# Patient Record
Sex: Male | Born: 1980
Health system: Southern US, Community
[De-identification: ages and names within clinical notes are randomized; demographics above are authoritative.]

## PROBLEM LIST (undated history)

## (undated) DIAGNOSIS — E785 Hyperlipidemia, unspecified: Secondary | ICD-10-CM

## (undated) HISTORY — PX: APPENDECTOMY: SHX54

## (undated) HISTORY — DX: Hyperlipidemia, unspecified: E78.5

## (undated) HISTORY — PX: TONSILLECTOMY: SUR1361

---

## 2003-10-15 ENCOUNTER — Emergency Department (HOSPITAL_COMMUNITY): Admission: EM | Admit: 2003-10-15 | Discharge: 2003-10-15 | Payer: Self-pay | Admitting: Emergency Medicine

## 2003-10-15 ENCOUNTER — Ambulatory Visit (HOSPITAL_COMMUNITY): Admission: EM | Admit: 2003-10-15 | Discharge: 2003-10-16 | Payer: Self-pay | Admitting: Emergency Medicine

## 2003-10-16 ENCOUNTER — Encounter (INDEPENDENT_AMBULATORY_CARE_PROVIDER_SITE_OTHER): Payer: Self-pay | Admitting: Specialist

## 2009-06-26 ENCOUNTER — Emergency Department (HOSPITAL_COMMUNITY): Admission: EM | Admit: 2009-06-26 | Discharge: 2009-06-26 | Payer: Self-pay | Admitting: Family Medicine

## 2010-01-14 ENCOUNTER — Ambulatory Visit: Payer: Self-pay | Admitting: Family Medicine

## 2010-04-05 NOTE — Assessment & Plan Note (Signed)
Summary: WRIST PAIN PER [facility]   History of Present Illness: right wrist pain several weeks--has been doing some extra weigt lifting--no specific injury bit has increasingly bothered him now. Pain with wrist flexed and pushing type actions. No prior issues with wrist. no prior wrist surgeries. no diabetic  Review of Systems       no numbness or tingling in his hand  Physical Exam  Msk:  R wrist FROM. TTP dorsum b/w index and long finger metacarpals Additional Exam:  US reveals fairly large fluid collection which was aspirated under ultraspund, injected with 1/2 cc kennalog 40 under steril conditions.   Impression & Recommendations:  Problem # 1:  WRIST PAIN, RIGHT (ICD-719.43)  ganglion cyst aspiration, injection with kennalog. Pressure bandage today. No weight lifting  48 hrs  Orders: No Charge Patient Arrived (NCPA0) (NCPA0)   Orders Added: 1)  No Charge Patient Arrived (NCPA0) [NCPA0]

## 2010-07-22 NOTE — Op Note (Signed)
Justin Douglas, Justin Douglas                         ACCOUNT NO.:  [id number]   MEDICAL RECORD NO.:  [id number]                   PATIENT TYPE:  INP   LOCATION:  5731                                 FACILITY:  [facility]   PHYSICIAN:  Jimmye Norman III, M.D.               DATE OF BIRTH:  01/09/1981   DATE OF PROCEDURE:  10/15/2003  DATE OF DISCHARGE:  10/16/2003                                 OPERATIVE REPORT   PREOPERATIVE DIAGNOSES:  Acute appendicitis.   POSTOPERATIVE DIAGNOSES:  Acute suppurative appendicitis without  perforation.   OPERATION PERFORMED:  Laparoscopic appendectomy.   SURGEON:  Marta Lamas. Lindie Spruce, M.D.   ANESTHESIA:  General endotracheal.   ESTIMATED BLOOD LOSS:  Less than 20 mL.   COMPLICATIONS:  None.   CONDITION:  Good.   SPECIMENS:  Appendix.   FINDINGS:  No perforation.  Minimal purulent fluid in the pelvis.   INDICATIONS FOR PROCEDURE:  The patient is a 30 year old with right lower  quadrant pain associated with nausea and vomiting, white count and a fever,  who now comes in for laparoscopic appendectomy.   DESCRIPTION OF PROCEDURE:  The patient was taken to the operating room and  placed on the table in the supine position.  After an adequate endotracheal  anesthetic was administered, the patient was prepped and draped in the usual  sterile manner exposing the midline and right upper quadrant.  A  supraumbilical curvilinear incision was made using a #11 blade down to the  midline fascia.  Through this midline fascia, a Veress needle was passed  into the peritoneal cavity and confirmed to be in position with the saline  test.  Once this was done, carbon dioxide insufflation was instilled into  the peritoneal cavity up to a maximum intra-abdominal pressure of 15 mmHg.   The patient was placed in Trendelenburg position.  An OptiView cannula 11 mm  was passed with the attached camera and ___________ into the peritoneal  cavity under direct vision.  Once we were  in adequate position we attached  the carbon dioxide gas and then passed two additional cannulas, one in the  right upper quadrant, one in the suprapubic area, that one being a 12 mm  cannula.   The patient's left side was tilted down.  We could see that the appendix was  acutely inflamed.  We dissected it along its base and then subsequently once  we had an adequate window between the mesoappendix, the cecum and the  appendix itself, we passed a 3.5 mm Endo GIA across the base of the appendix  and fired it.  We then passed a 2.5 mm closure Endo GIA across the  mesoappendix, fired that and then subsequently brought the appendix out  through the suprapubic cannula with an EndoCatch bag without contamination  of the underlying subcutaneous tissue.  Once this was done, we irrigated  with saline, about 700 mL was used.  We did that and subsequently removed  all cannulas, aspirating gas as we came out.   Once all cannulas were out, we reapproximated the supraumbilical fascia  using a figure-of-eight stitch of 0 Vicryl.  0.25% Marcaine with epinephrine  was injected at all sites and then the skin was closed using running  subcuticular stitch of 5-0 Vicryl.  Sterile dressings were applied including  Steri-Strips and antibiotic ointment.                                               Kathrin Ruddy, M.D.    JW/MEDQ  D:  10/15/2003  T:  10/16/2003  Job:  [id number]

## 2010-07-22 NOTE — H&P (Signed)
Justin Douglas, Justin Douglas                         ACCOUNT NO.:  [id number]   MEDICAL RECORD NO.:  [id number]                   PATIENT TYPE:  INP   LOCATION:  1828                                 FACILITY:  [facility]   PHYSICIAN:  Jimmye Norman III, M.D.               DATE OF BIRTH:  06/02/1980   DATE OF ADMISSION:  10/15/2003  DATE OF DISCHARGE:                                HISTORY & PHYSICAL   CHIEF COMPLAINT:  The patient is a 30 year old male with right lower  quadrant abdominal pain and leukocytosis likely acute appendicitis.   HISTORY OF PRESENT ILLNESS:  The patient stated getting ill about 2:00  o'clock this morning at home when he developed nausea, some vomiting,  diffuse lower abdominal pain.  It got to the point where he came to the  emergency room where he was evaluated and not thought to have any symptoms  significant for appendicitis.  He had a CBC done which showed an increased  white blood cell count of 11,000 with a left shift, he was sent home after  getting a GI cocktail.  He returned later on after his pain worsened in  spite of getting nausea medication.  His white count increased to 14,000,  his pain is localized more to the right lower quadrant, surgical  consultation was obtained.   PAST MEDICAL HISTORY:  Unremarkable.  He takes no medications.   PAST SURGICAL HISTORY:  Tonsils and adenoids as an infant.   ALLERGIES:  HE HAS NO KNOWN DRUG ALLERGIES.   FAMILY HISTORY:  Unremarkable and noncontributory.   REVIEW OF SYSTEMS:  He has had nausea, vomiting, poor appetite all day, no  chills or fevers, although he does have a slight temp up to 99.7 currently.   PHYSICAL EXAMINATION:  As mentioned his temp was at 98.1, he is now up to  99.7.  His pulse is normal.  Blood pressure within normal limits.  He is  normocephalic and atraumatic, anicteric.  He does appear to be in distress  even laying still on the gurney.  NECK:  Supple.  No palpable masses.  No thyroid  lesion.  LUNGS:  Clear to auscultation.  CARDIAC:  Regular rhythm and rate with no murmurs, gallops, rubs or heaves.  ABDOMEN:  Slightly distended for a very thin young man with hypoactive bowel  sounds, positive Rovsing's sign, positive guarding, rebound, and tap and  cough tenderness.  RECTAL:  No palpable masses.   LABORATORY STUDIES:  White count 14,000, hemoglobin 16.  UA is pending.  CT  scan was not done.   IMPRESSION:  Likely acute appendicitis in an otherwise healthy 30 year old  male.   PLAN:  Performed a laparoscopic appendectomy.  This will be done in the  operating room as soon as possible.  I have explained the procedure to the  patient and to his mother who is present with him, his father is on  the way  in, we will talk with him after surgery.  A Foley will be inserted at the  time of surgery and removed prior to awakening.                                                Kathrin Ruddy, M.D.    JW/MEDQ  D:  10/15/2003  T:  10/15/2003  Job:  [id number]

## 2011-03-14 ENCOUNTER — Encounter: Payer: Self-pay | Admitting: Family Medicine

## 2011-03-14 NOTE — Progress Notes (Signed)
Patient ID: Justin Douglas, male   DOB: [birth year], 31 y.o.   MRN: [id number] LDL cholesterol of 184. Eats fairly healthy diet--trying to get insurance coverage and it will be lower if he is at goal. We discussed and will start simvastatin and he will f/u in 6 weeks with me. Will get FLP and CMP then. I reviewed his labs fromhealth screening

## 2011-04-28 ENCOUNTER — Encounter: Payer: Self-pay | Admitting: Family Medicine

## 2011-04-28 ENCOUNTER — Other Ambulatory Visit: Payer: Self-pay | Admitting: Family Medicine

## 2011-04-28 DIAGNOSIS — N63 Unspecified lump in unspecified breast: Secondary | ICD-10-CM

## 2011-04-28 DIAGNOSIS — N62 Hypertrophy of breast: Secondary | ICD-10-CM | POA: Insufficient documentation

## 2011-04-28 NOTE — Progress Notes (Signed)
Patient ID: Justin Douglas, male   DOB: January 22, 1981, 31 y.o.   MRN: [id number] Found a right sided breast lump a few days ago. Non tender . No d/c. US revealed a mass that does not appear to be hyper echoic. Will send for formal US and dx mammography. No use of anabloic steroids recently, but many years ago, he did use some. No tobacco. No FH breast ca.

## 2011-05-08 ENCOUNTER — Other Ambulatory Visit: Payer: Self-pay | Admitting: Family Medicine

## 2011-05-08 ENCOUNTER — Ambulatory Visit
Admission: RE | Admit: 2011-05-08 | Discharge: 2011-05-08 | Disposition: A | Discharge status: Home / Self Care | Payer: 59 | Source: Ambulatory Visit | Attending: Family Medicine | Admitting: Family Medicine

## 2011-05-08 ENCOUNTER — Telehealth: Payer: Self-pay | Admitting: Family Medicine

## 2011-05-08 ENCOUNTER — Ambulatory Visit (INDEPENDENT_AMBULATORY_CARE_PROVIDER_SITE_OTHER): Payer: 59 | Admitting: Family Medicine

## 2011-05-08 DIAGNOSIS — N62 Hypertrophy of breast: Secondary | ICD-10-CM

## 2011-05-08 DIAGNOSIS — J019 Acute sinusitis, unspecified: Secondary | ICD-10-CM | POA: Insufficient documentation

## 2011-05-08 DIAGNOSIS — N63 Unspecified lump in unspecified breast: Secondary | ICD-10-CM

## 2011-05-08 MED ORDER — AZITHROMYCIN 250 MG PO TABS: ORAL_TABLET | ORAL | Status: AC

## 2011-05-08 NOTE — Progress Notes (Signed)
  Subjective:    Patient ID: CLOYD RAGAS, male    DOB: 04-19-1980, 30 y.o.   MRN: [id number]  HPI 1. Sinusitis One week hx of frontal headache with tooth pain and sensitivity as well as congestion, rhinorrhea (non-purulent). Sore throat and postnasal drip.  No cough. No chest pain or SOB. Not able to sleep because of congestion.  No sick contacts. No recent medications.    Review of Systems Pertinent items are noted in HPI. No fever, chills, night sweats, weight loss.     Objective:   Physical Exam General: c/m, appears tired with dark circles under eyes. Pulse: tachycardic on palpation and auscultation Lungs:  Normal respiratory effort, chest expands symmetrically. Lungs are clear to auscultation, no crackles or wheezes. Heart - Regular rate and rhythm.  No murmurs, gallops or rubs.    Ears:  External ear exam shows no significant lesions or deformities.  Otoscopic examination reveals clear canals, tympanic membranes are intact bilaterally with minimal  bulging, no retraction, inflammation or discharge. Hearing is grossly normal bilaterally Throat: normal mucosa, no exudate, uvula midline, no redness Sinus: tender to palpation of frontal and maxillary sinus. Nose:  External nasal examination shows no deformity or inflammation. Nasal mucosa are pink and without lesions or exudates. No septal dislocation or dislocation.No obstruction to airflow. Right breast: non-tender, non-cystic mass at 9 oclock. No skin changes, no nipple discharge. Mass is not fully mobile.    Assessment & Plan:   Breast mass being followed by another physician - he is having a mammogram today, ultrasound did not reveal cystic structure.

## 2011-05-08 NOTE — Patient Instructions (Signed)

## 2011-05-08 NOTE — Assessment & Plan Note (Signed)
Sinusitis Will give 3 day course of azithromycin because of protracted course (one week)

## 2011-05-08 NOTE — Telephone Encounter (Signed)
Normal mammo--notified by phone---will watch and see of breast tissue increases in size---if it does, consider w/u gynecomastiqa

## 2011-05-09 ENCOUNTER — Other Ambulatory Visit: Payer: Self-pay | Admitting: Family Medicine

## 2011-05-09 DIAGNOSIS — E785 Hyperlipidemia, unspecified: Secondary | ICD-10-CM | POA: Insufficient documentation

## 2011-05-09 DIAGNOSIS — N62 Hypertrophy of breast: Secondary | ICD-10-CM

## 2011-05-10 ENCOUNTER — Other Ambulatory Visit: Payer: 59

## 2011-05-10 DIAGNOSIS — E785 Hyperlipidemia, unspecified: Secondary | ICD-10-CM

## 2011-05-10 DIAGNOSIS — N62 Hypertrophy of breast: Secondary | ICD-10-CM

## 2011-05-10 LAB — BASIC METABOLIC PANEL
Chloride: 103 mEq/L (ref 96–112)
Sodium: 141 mEq/L (ref 135–145)

## 2011-05-10 LAB — LIPID PANEL: Cholesterol: 164 mg/dL (ref 0–200)

## 2011-05-10 NOTE — Progress Notes (Signed)
Addended by: Deno Etienne on: 05/10/2011 08:29 AM   Modules accepted: Orders

## 2011-05-10 NOTE — Progress Notes (Signed)
Labs done today Justin Douglas 

## 2011-05-11 ENCOUNTER — Encounter: Payer: Self-pay | Admitting: Family Medicine

## 2011-05-11 LAB — ESTRADIOL, FREE

## 2011-05-12 LAB — LUTEINIZING HORMONE: LH: 7.2 m[IU]/mL (ref 1.5–9.3)

## 2011-05-12 LAB — TESTOSTERONE: Testosterone: 320.52 ng/dL (ref 250–890)

## 2011-05-12 LAB — ESTRADIOL: Estradiol: 20.4 pg/mL

## 2011-05-14 LAB — BETA HCG QUANT (REF LAB): Beta hCG, Tumor Marker: <0.5 m[IU]/mL (ref <5.0)

## 2011-05-29 ENCOUNTER — Other Ambulatory Visit: Payer: Self-pay | Admitting: Family Medicine

## 2011-05-29 DIAGNOSIS — R05 Cough: Secondary | ICD-10-CM

## 2011-05-29 NOTE — Progress Notes (Signed)
6 Weeks if continued cough status post respiratory infection. He says it usually takes 2 months for his cough to clear which is very unusual. I think he needs a chest x-ray we'll set that up. His lungs have occasional only scattered crackles no expiratory wheeze on exam.

## 2011-05-31 ENCOUNTER — Ambulatory Visit (HOSPITAL_COMMUNITY)
Admission: RE | Admit: 2011-05-31 | Discharge: 2011-05-31 | Disposition: A | Discharge status: Home / Self Care | Payer: 59 | Source: Ambulatory Visit | Attending: Family Medicine | Admitting: Family Medicine

## 2011-05-31 DIAGNOSIS — R059 Cough, unspecified: Secondary | ICD-10-CM | POA: Insufficient documentation

## 2011-05-31 DIAGNOSIS — R05 Cough: Secondary | ICD-10-CM | POA: Insufficient documentation

## 2011-06-23 ENCOUNTER — Other Ambulatory Visit: Payer: Self-pay | Admitting: Family Medicine

## 2011-06-23 DIAGNOSIS — J45991 Cough variant asthma: Secondary | ICD-10-CM

## 2011-06-23 NOTE — Progress Notes (Signed)
Cough for 6-7 weeks--notes he gets a persistent cough after almost any viral URI. He has no wheezes in his lungs, normal resp effort. Cough is aggravating, non productive. We did CXR and negative. i suspect this is an asthma variant---let's set up pfts.He is a non smoker.

## 2011-06-27 ENCOUNTER — Other Ambulatory Visit: Payer: Self-pay | Admitting: Family Medicine

## 2011-06-27 DIAGNOSIS — R05 Cough: Secondary | ICD-10-CM

## 2011-06-27 MED ORDER — BECLOMETHASONE DIPROPIONATE 40 MCG/ACT IN AERS: 2.0000 | INHALATION_SPRAY | Freq: Two times a day (BID) | RESPIRATORY_TRACT | Status: DC

## 2011-07-11 ENCOUNTER — Other Ambulatory Visit: Payer: Self-pay | Admitting: Family Medicine

## 2011-07-11 MED ORDER — ESOMEPRAZOLE MAGNESIUM 40 MG PO CPDR: 40.0000 mg | DELAYED_RELEASE_CAPSULE | Freq: Every day | ORAL | Status: DC

## 2011-07-11 NOTE — Progress Notes (Signed)
Justin Douglas is having no improvement in cough---worse at night or if he talks a lot or laughs.I am wondering is thereis vocal cord irritation / inflammation or dysfunction at the heart of all of this. He has had hx of GERD sx in past---currently he has noone that he is aware of. We will stop the inhaler and start nexium to treat silent GERD. If this does not start to improve in  3-4 weeks will need to reeval. [name]

## 2011-08-02 ENCOUNTER — Encounter: Payer: Self-pay | Admitting: Family Medicine

## 2011-08-02 NOTE — Progress Notes (Signed)
Patient ID: BRONTE KROPF, male   DOB: 07-Dec-1980, 31 y.o.   MRN: [id number] Cough has resolved. He is unclear whether that is related to the final clearing of his respiratory infection or to the treatment of silent reflux disease. We discussed. I recommended he continue treatment with the proton pump inhibitor for another 6 weeks and then stop. If the cough returns then I think we have our answer. The cough has not returned I would recommend we empirically treat with proton pump inhibitor at the seasonal time of year when he typically experiences this which would be next September or October. Our plan to treat for 3-4 months. Is agreement with this plan. He'll let me know if his symptoms return

## 2012-07-11 IMAGING — CR DG CHEST 2V
2 series · 2 of 2 positions shown · non-contrast
Comparison: None.

CLINICAL DATA: Cough

CHEST - 2 VIEW

[w chest pa]
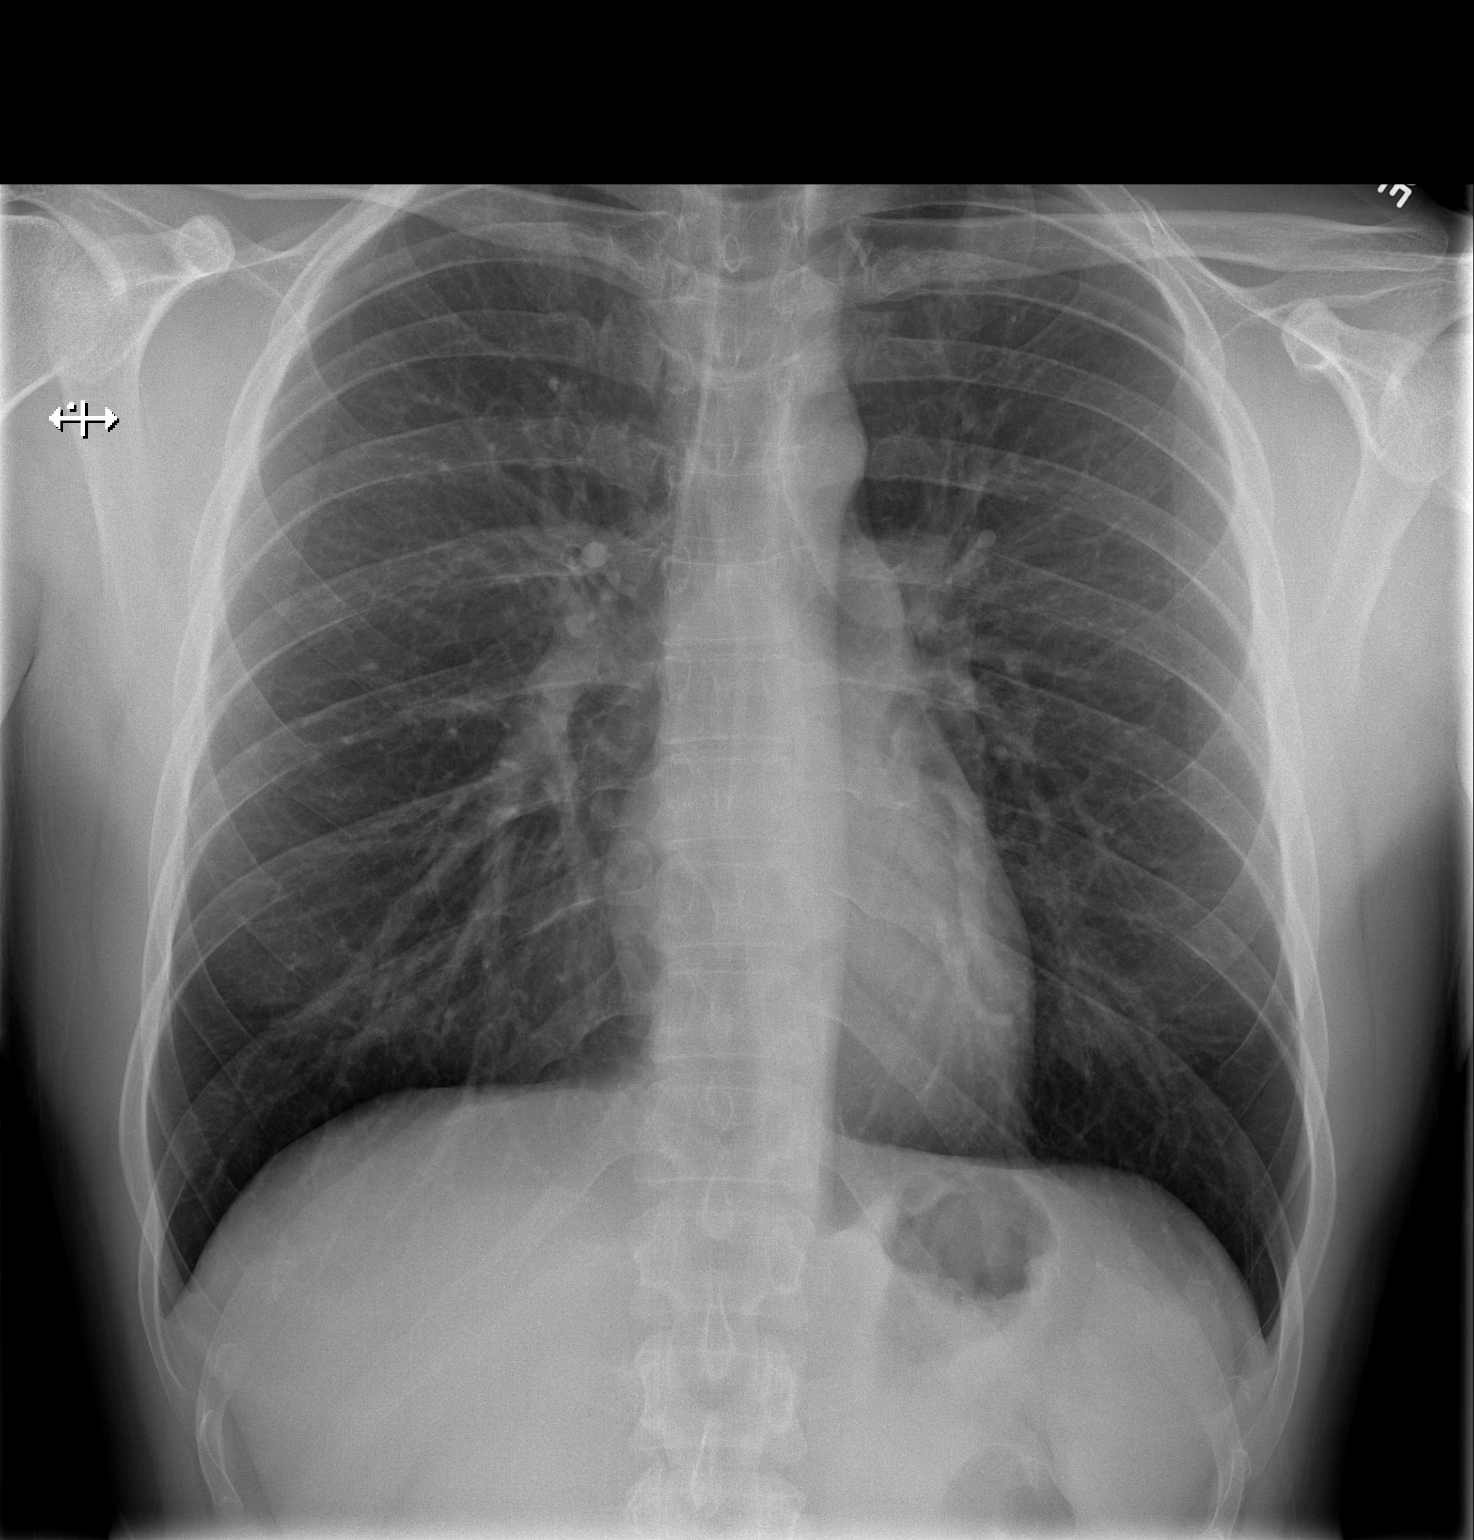

[w chest lat]
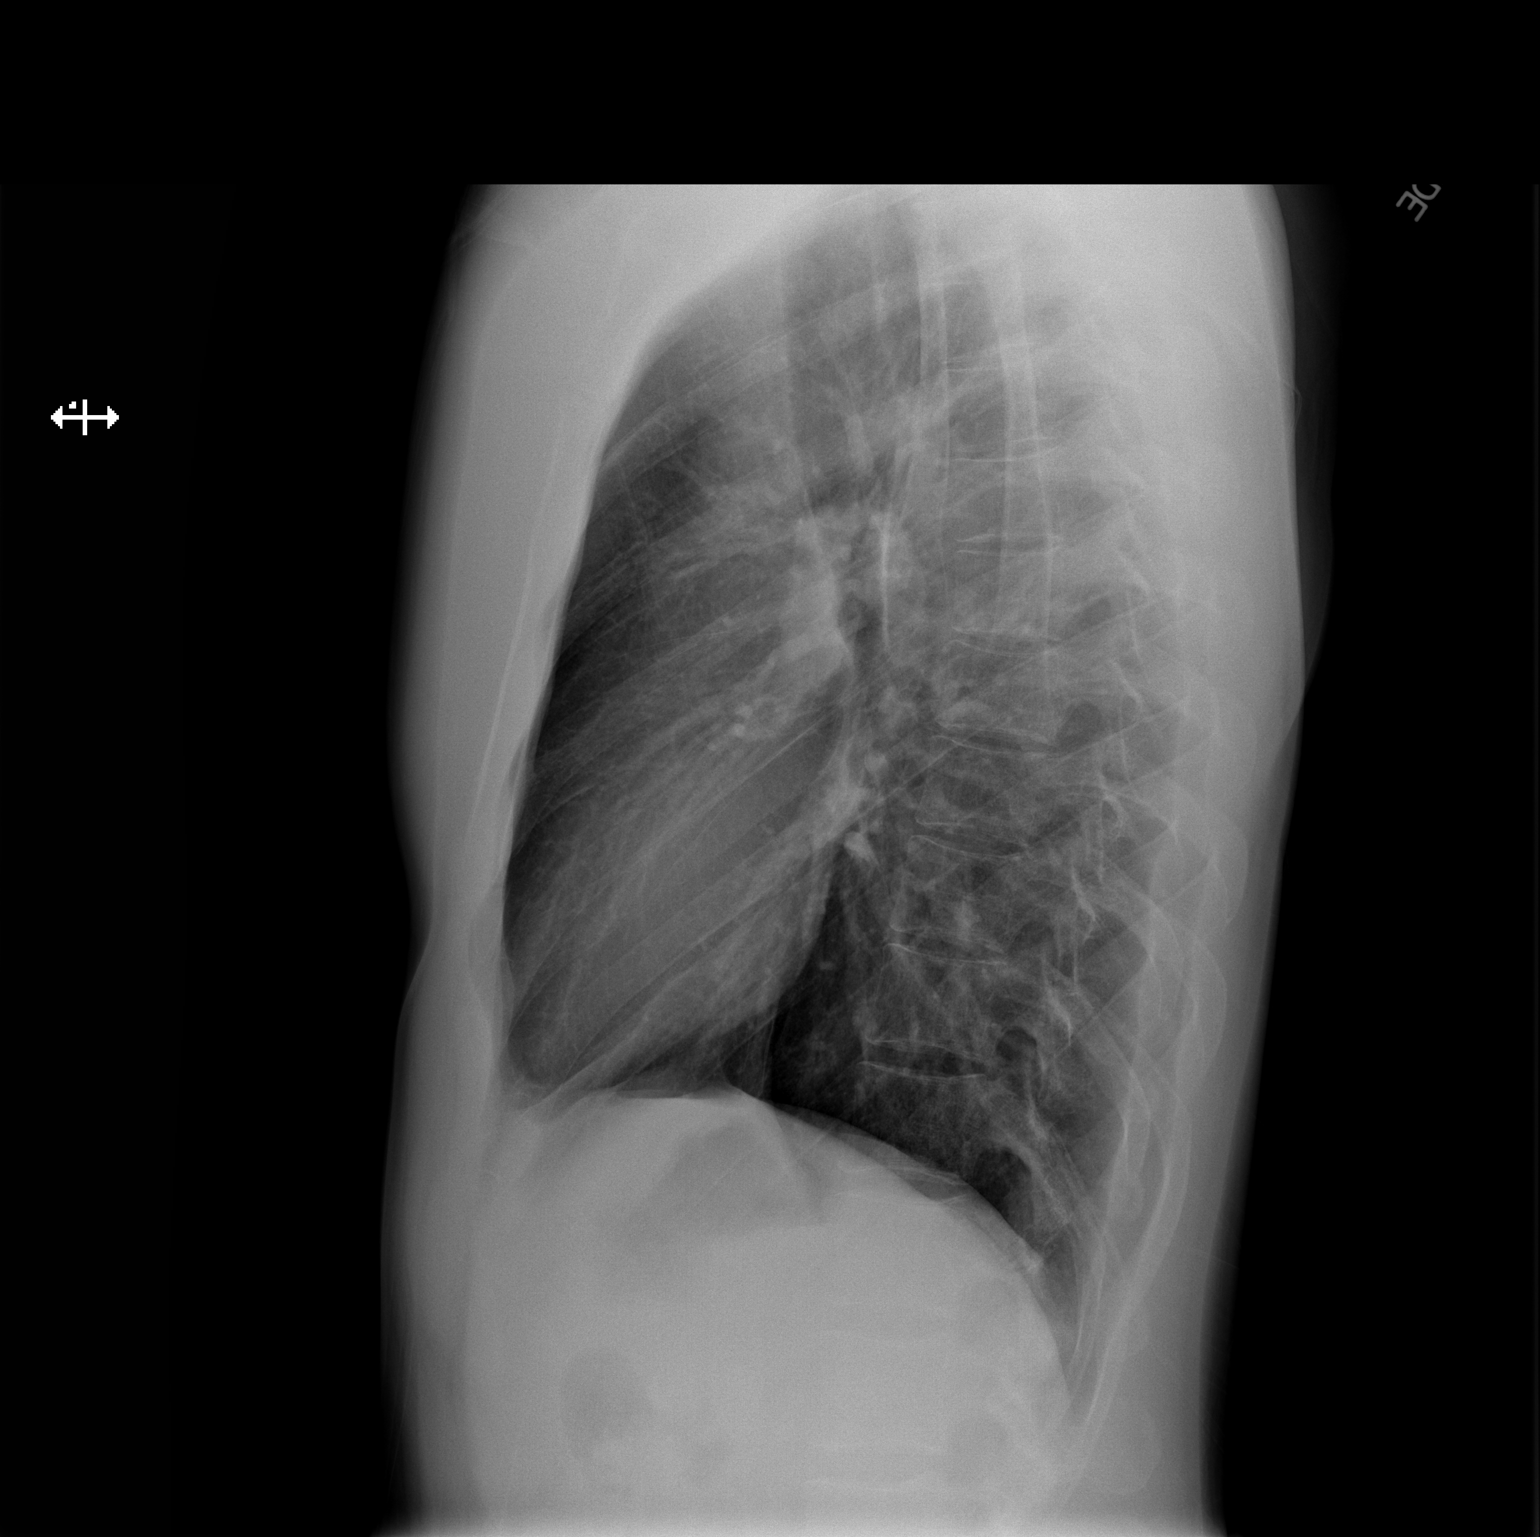

[2 of 2 positions shown; findings below may reference images not displayed]

FINDINGS: Lungs are clear. No pleural effusion or pneumothorax.

Cardiomediastinal silhouette is within normal limits.

Visualized osseous structures are within normal limits.
IMPRESSION: No evidence of acute cardiopulmonary disease.

## 2012-08-12 ENCOUNTER — Ambulatory Visit (INDEPENDENT_AMBULATORY_CARE_PROVIDER_SITE_OTHER): Payer: 59 | Admitting: *Deleted

## 2012-08-12 DIAGNOSIS — Z111 Encounter for screening for respiratory tuberculosis: Secondary | ICD-10-CM

## 2012-08-13 NOTE — Progress Notes (Signed)
Tuberculin skin test applied to left ventral forearm. Patient will have site read in 48-72 hours.  Arlyss Repress, CMA

## 2012-08-15 ENCOUNTER — Ambulatory Visit (INDEPENDENT_AMBULATORY_CARE_PROVIDER_SITE_OTHER): Payer: 59 | Admitting: *Deleted

## 2012-08-15 DIAGNOSIS — Z111 Encounter for screening for respiratory tuberculosis: Secondary | ICD-10-CM

## 2012-08-15 LAB — TB SKIN TEST
Induration: 0 mm
TB Skin Test: NEGATIVE

## 2015-07-23 ENCOUNTER — Ambulatory Visit (INDEPENDENT_AMBULATORY_CARE_PROVIDER_SITE_OTHER): Payer: 59 | Admitting: Family Medicine

## 2015-07-23 ENCOUNTER — Encounter: Payer: Self-pay | Admitting: Family Medicine

## 2015-07-23 VITALS — BP 129/83 | Ht 66.5 in | Wt 140.0 lb

## 2015-07-23 DIAGNOSIS — M6740 Ganglion, unspecified site: Secondary | ICD-10-CM | POA: Diagnosis not present

## 2015-07-23 NOTE — Progress Notes (Signed)
Patient ID: Jennette BillRobert L Liberto, male   DOB: 06/11/1980, 35 y.o.   MRN: [id number] Recurrence of right dorsal wrist ganglion cyst. We aspirated it about 2 years ago and no problems until last 1 month. wouldlike to have aspiration again. US typical appearing dorsal cyst. INJECTION: Patient was given informed consent, signed copy in the chart. Appropriate time out was taken. Area prepped and draped in usual sterile fashion. 1.5 cc lidocaine for local anestesia.  A 18 gauge needle used to attempt aspiration--no fluid returned but [place] showed that the needle was indeed within the cyst.Pressure was applied and it decompressed. Then the su=yst was iinfiltrated with 1/2 cc methylprednisolone 40 mg/ml, some of which leaked back out on withdrawal of needle. Bandaid applied. Post procedure instructions given.

## 2017-07-04 ENCOUNTER — Other Ambulatory Visit: Payer: Self-pay

## 2017-07-04 ENCOUNTER — Ambulatory Visit (INDEPENDENT_AMBULATORY_CARE_PROVIDER_SITE_OTHER): Payer: 59 | Admitting: Family Medicine

## 2017-07-04 ENCOUNTER — Encounter: Payer: Self-pay | Admitting: Family Medicine

## 2017-07-04 VITALS — BP 100/68 | HR 63 | Temp 98.3°F | Ht 67.0 in | Wt 148.0 lb

## 2017-07-04 DIAGNOSIS — Z0000 Encounter for general adult medical examination without abnormal findings: Secondary | ICD-10-CM

## 2017-07-05 DIAGNOSIS — Z0000 Encounter for general adult medical examination without abnormal findings: Secondary | ICD-10-CM | POA: Insufficient documentation

## 2017-07-05 LAB — CMP14+EGFR
A/G RATIO: 1.9 (ref 1.2–2.2)
ALBUMIN: 4.6 g/dL (ref 3.5–5.5)
ALK PHOS: 55 IU/L (ref 39–117)
ALT: 24 IU/L (ref 0–44)
AST: 24 IU/L (ref 0–40)
BILIRUBIN TOTAL: 0.4 mg/dL (ref 0.0–1.2)
BUN / CREAT RATIO: 22 — AB (ref 9–20)
BUN: 22 mg/dL — ABNORMAL HIGH (ref 6–20)
CHLORIDE: 100 mmol/L (ref 96–106)
CO2: 25 mmol/L (ref 20–29)
CREATININE: 1 mg/dL (ref 0.76–1.27)
Calcium: 9.2 mg/dL (ref 8.7–10.2)
GFR calc Af Amer: 109 mL/min/{1.73_m2} (ref >59)
GFR calc non Af Amer: 94 mL/min/{1.73_m2} (ref >59)
GLOBULIN, TOTAL: 2.4 g/dL (ref 1.5–4.5)
Glucose: 106 mg/dL — ABNORMAL HIGH (ref 65–99)
POTASSIUM: 4.2 mmol/L (ref 3.5–5.2)
SODIUM: 140 mmol/L (ref 134–144)
Total Protein: 7 g/dL (ref 6.0–8.5)

## 2017-07-05 LAB — LDL CHOLESTEROL, DIRECT: LDL Direct: 167 mg/dL — ABNORMAL HIGH (ref 0–99)

## 2017-07-05 NOTE — Progress Notes (Signed)
    CHIEF COMPLAINT / HPI: In for well adult checkup.  No problems.  Has been exercising regularly.  Eating more fruits and vegetables, less "junk food".  Feels great.  Also decreased caffeine.  REVIEW OF SYSTEMS: Review of Systems  Constitutional: Negative for activity chang; no  appetite change and no unexpected weight change.  Eyes: Negative for eye pain and no visual disturbance.  Neck: denies neck pain; no swallowing problems CV: No chest pain, no shortness of breath, no lower extremity edema. No change in exercise tolerance Respiratory: Negative for cough or wheezing.  No shortness of breath. Gastrointestinal: Negative for abdominal pain, no diarrhea and no  constipation.  Genitourinary: Negative for decreased urine volume and  no difficulty urinating.  Musculoskeletal: Negative for arthralgias. No muscle weakness. Skin: Negative for rash.  Psychiatric/Behavioral: Negative for behavioral problems; no sleep disturbance and no  agitation.     PERTINENT  PMH / PSH: I have reviewed the patient's medications, allergies, past medical and surgical history, smoking status and updated in the EMR as appropriate.   OBJECTIVE: Vital signs reviewed GENERALl: Well developed, well nourished, in no acute distress. HEENT: PERRLA, EOMI, sclerae are nonicteric NECK: Supple, FROM, without lymphadenopathy.  THYROID: normal without nodularity CAROTID ARTERIES: without bruits LUNGS: clear to auscultation bilaterally. No wheezes or rales. Normal respiratory effort HEART: Regular rate and rhythm, no murmurs. Distal pulses are bilaterally symmetrical, 2+. ABDOMEN: soft with positive bowel sounds. No masses noted MSK: MOE x 4. Normal muscle strength, bulk and tone. SKIN no rash. Normal temperature. NEURO: no focal deficits. Normal gait. Normal balance.   ASSESSMENT / PLAN: Please see problem oriented charting for details

## 2017-07-05 NOTE — Assessment & Plan Note (Signed)
Congratulated on his lifestyle modifications.  We will check some blood work.  Continue current medication regimen

## 2017-07-06 ENCOUNTER — Encounter: Payer: Self-pay | Admitting: Family Medicine

## 2017-07-06 ENCOUNTER — Other Ambulatory Visit: Payer: Self-pay | Admitting: Family Medicine

## 2017-07-06 MED ORDER — ATORVASTATIN CALCIUM 40 MG PO TABS: 40.0000 mg | ORAL_TABLET | Freq: Every day | ORAL | 3 refills | Status: DC

## 2017-07-06 MED FILL — ATORVASTATIN 40 MG TABLET: 40 | 90 days supply | Qty: 90 | Fill #0

## 2017-10-26 MED FILL — ATORVASTATIN 40 MG TABLET: 40 | 90 days supply | Qty: 90 | Fill #1

## 2018-03-12 MED FILL — ATORVASTATIN 40 MG TABLET: 40 | 90 days supply | Qty: 90 | Fill #2

## 2018-07-16 ENCOUNTER — Other Ambulatory Visit: Payer: Self-pay

## 2018-07-16 ENCOUNTER — Telehealth: Payer: Self-pay

## 2018-07-16 MED ORDER — ATORVASTATIN CALCIUM 40 MG PO TABS: 40.0000 mg | ORAL_TABLET | Freq: Every day | ORAL | 3 refills | Status: DC

## 2018-07-17 MED FILL — ATORVASTATIN 40 MG TABLET: 40 | 90 days supply | Qty: 90 | Fill #0

## 2018-07-25 NOTE — Telephone Encounter (Signed)
Done

## 2019-01-20 MED FILL — ATORVASTATIN 40 MG TABLET: 40 | 90 days supply | Qty: 90 | Fill #0

## 2019-05-30 MED FILL — ATORVASTATIN 40 MG TABLET: 40 | 90 days supply | Qty: 90 | Fill #1

## 2019-07-18 ENCOUNTER — Other Ambulatory Visit: Payer: Self-pay

## 2019-07-18 ENCOUNTER — Ambulatory Visit: Payer: 59 | Admitting: Family Medicine

## 2019-07-18 ENCOUNTER — Encounter: Payer: Self-pay | Admitting: Family Medicine

## 2019-07-18 VITALS — BP 102/70 | HR 72 | Wt 156.0 lb

## 2019-07-18 DIAGNOSIS — Z114 Encounter for screening for human immunodeficiency virus [HIV]: Secondary | ICD-10-CM | POA: Diagnosis not present

## 2019-07-18 DIAGNOSIS — Z1159 Encounter for screening for other viral diseases: Secondary | ICD-10-CM | POA: Diagnosis not present

## 2019-07-18 DIAGNOSIS — Z0000 Encounter for general adult medical examination without abnormal findings: Secondary | ICD-10-CM

## 2019-07-18 DIAGNOSIS — E785 Hyperlipidemia, unspecified: Secondary | ICD-10-CM | POA: Diagnosis not present

## 2019-07-18 DIAGNOSIS — Z800 Family history of malignant neoplasm of digestive organs: Secondary | ICD-10-CM

## 2019-07-18 NOTE — Assessment & Plan Note (Signed)
Denies change in stools melena or hematochezia.  Given the strong family history recommended consultation with gastroenterology for consideration of early colonoscopy.

## 2019-07-18 NOTE — Assessment & Plan Note (Signed)
Doing very well maintaining health.  Recommended lipid panel today.  Hepatitis C and HIV as well.  Will check metabolic panel when fasting given strong family history of diabetes.  Patient has healthy eating habits, already engages in moderate physical activity for 30 minutes 5 times per week (or 150 minutes).

## 2019-07-18 NOTE — Patient Instructions (Signed)
It was GREAT to see you today.  Follow up with Dr. Jennette Kettle in 1 year to check in  I sent a referral to [facility]---let us know if you do not hear  Let me know if you need a refill on your statin   Terisa Starr, MD  Family Medicine

## 2019-07-18 NOTE — Progress Notes (Signed)
    SUBJECTIVE:   CHIEF COMPLAINT / HPI:  Justin Douglas is a 39 year old gentleman with history significant for dyslipidemia presenting today for an annual exam.  He is overall very healthy.  He exercises at least 150 minutes/week.  He eats a variety of fruits and vegetables.  He does not smoke or drink excessive alcohol.  He has no specific concerns today.  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  He is also completing courses and doing very well in classes.  He is looking forward to graduating.  Denies concerns today.   PERTINENT  PMH / PSH/Family/Social History :   Updated medical, surgical and family history.  Family history notable for Alzheimer's dementia on both sides.  He also has a strong family history of colon cancer as well as colonic polyps. PGM had colon cancer, passed away in 75s Brother has numerous polyps (8) Father also has polyps  OBJECTIVE:   BP 102/70   Pulse 72   Wt 156 lb (70.8 kg)   SpO2 98%   BMI 24.43 kg/m   HEENT: Sclera anicteric, PERRL  Neck: Supple Cardiac: Regular rate and rhythm. Normal S1/S2. No murmurs, rubs, or gallops appreciated. Lungs: Clear bilaterally to ascultation.  Abdomen: Normoactive bowel sounds. No tenderness to deep or light palpation. No rebound or guarding.  Skin: Warm, dry Psych: Pleasant and appropriate   ASSESSMENT/PLAN:   Well adult exam Doing very well maintaining health.  Recommended lipid panel today.  Hepatitis C and HIV as well.  Will check metabolic panel when fasting given strong family history of diabetes. Patient has healthy eating habits, already engages in moderate physical activity for 30 minutes 5 times per week (or 150 minutes).   Family history of colorectal cancer Denies change in stools melena or hematochezia.  Given the strong family history recommended consultation with gastroenterology for consideration of early colonoscopy.  Up to date on other HCM measures.   Terisa Starr, MD  Family Medicine Teaching  Service  [facility]

## 2019-07-23 ENCOUNTER — Encounter: Payer: Self-pay | Admitting: Family Medicine

## 2019-07-23 ENCOUNTER — Other Ambulatory Visit: Payer: Self-pay | Admitting: Family Medicine

## 2019-07-23 DIAGNOSIS — Z114 Encounter for screening for human immunodeficiency virus [HIV]: Secondary | ICD-10-CM | POA: Diagnosis not present

## 2019-07-23 DIAGNOSIS — Z1159 Encounter for screening for other viral diseases: Secondary | ICD-10-CM | POA: Diagnosis not present

## 2019-07-23 DIAGNOSIS — E785 Hyperlipidemia, unspecified: Secondary | ICD-10-CM | POA: Diagnosis not present

## 2019-07-24 LAB — COMPREHENSIVE METABOLIC PANEL
ALT: 30 IU/L (ref 0–44)
AST: 32 IU/L (ref 0–40)
Albumin/Globulin Ratio: 2.4 — ABNORMAL HIGH (ref 1.2–2.2)
Albumin: 5 g/dL (ref 4.0–5.0)
Alkaline Phosphatase: 62 IU/L (ref 48–121)
BUN/Creatinine Ratio: 18 (ref 9–20)
BUN: 21 mg/dL (ref 6–24)
Bilirubin Total: 0.8 mg/dL (ref 0.0–1.2)
CO2: 24 mmol/L (ref 20–29)
Calcium: 9.8 mg/dL (ref 8.7–10.2)
Chloride: 102 mmol/L (ref 96–106)
Creatinine, Ser: 1.19 mg/dL (ref 0.76–1.27)
GFR calc Af Amer: 87 mL/min/{1.73_m2} (ref >59)
GFR calc non Af Amer: 75 mL/min/{1.73_m2} (ref >59)
Globulin, Total: 2.1 g/dL (ref 1.5–4.5)
Glucose: 92 mg/dL (ref 65–99)
Potassium: 4.9 mmol/L (ref 3.5–5.2)
Sodium: 140 mmol/L (ref 134–144)
Total Protein: 7.1 g/dL (ref 6.0–8.5)

## 2019-07-24 LAB — LIPID PANEL
Chol/HDL Ratio: 3.2 ratio (ref 0.0–5.0)
Cholesterol, Total: 194 mg/dL (ref 100–199)
HDL: 60 mg/dL (ref >39)
LDL Chol Calc (NIH): 120 mg/dL — ABNORMAL HIGH (ref 0–99)
Triglycerides: 77 mg/dL (ref 0–149)
VLDL Cholesterol Cal: 14 mg/dL (ref 5–40)

## 2019-07-24 LAB — HCV INTERPRETATION

## 2019-07-24 LAB — HIV ANTIBODY (ROUTINE TESTING W REFLEX): HIV Screen 4th Generation wRfx: NONREACTIVE

## 2019-07-24 LAB — HCV AB W REFLEX TO QUANT PCR: HCV Ab: <0.1 s/co ratio (ref 0.0–0.9)

## 2019-08-19 ENCOUNTER — Encounter: Payer: Self-pay | Admitting: Family Medicine

## 2019-10-03 ENCOUNTER — Other Ambulatory Visit: Payer: Self-pay | Admitting: Family Medicine

## 2019-10-03 MED ORDER — ATORVASTATIN CALCIUM 40 MG PO TABS: 40.0000 mg | ORAL_TABLET | Freq: Every day | ORAL | 3 refills | Status: DC

## 2019-10-03 MED FILL — ATORVASTATIN 40 MG TABLET: 40 | 90 days supply | Qty: 90 | Fill #0

## 2019-10-13 MED FILL — ATORVASTATIN 40 MG TABLET: 40 | 90 days supply | Qty: 90 | Fill #0

## 2020-03-19 MED FILL — ATORVASTATIN 40 MG TABLET: 40 | 90 days supply | Qty: 90 | Fill #1

## 2020-05-04 ENCOUNTER — Ambulatory Visit: Payer: 59 | Admitting: Sports Medicine

## 2020-08-05 ENCOUNTER — Other Ambulatory Visit (HOSPITAL_COMMUNITY): Payer: Self-pay

## 2020-08-05 MED FILL — Atorvastatin Calcium Tab 40 MG (Base Equivalent): ORAL | 90 days supply | Qty: 90 | Fill #0 | Status: AC

## 2020-10-30 ENCOUNTER — Encounter (HOSPITAL_COMMUNITY): Payer: Self-pay | Admitting: *Deleted

## 2020-10-30 ENCOUNTER — Other Ambulatory Visit: Payer: Self-pay

## 2020-10-30 ENCOUNTER — Ambulatory Visit (HOSPITAL_COMMUNITY)
Admission: EM | Admit: 2020-10-30 | Discharge: 2020-10-30 | Disposition: A | Discharge status: Home / Self Care | Payer: 59 | Attending: Family Medicine | Admitting: Family Medicine

## 2020-10-30 DIAGNOSIS — S6992XA Unspecified injury of left wrist, hand and finger(s), initial encounter: Secondary | ICD-10-CM

## 2020-10-30 MED ORDER — SULFAMETHOXAZOLE-TRIMETHOPRIM 800-160 MG PO TABS: 1.0000 | ORAL_TABLET | Freq: Two times a day (BID) | ORAL | 0 refills | Status: AC

## 2020-10-30 MED ORDER — HIBICLENS 4 % EX LIQD: Freq: Every day | CUTANEOUS | 0 refills | Status: DC | PRN

## 2020-10-30 NOTE — ED Triage Notes (Addendum)
Pt here for fish hook removal from Lt finger.

## 2020-10-30 NOTE — ED Provider Notes (Signed)
[facility]    CSN: [id number] Arrival date & time: 10/30/20  1505      History   Chief Complaint Chief Complaint  Patient presents with   fish hook    HPI Justin Douglas is a 40 y.o. male.   Patient presenting today with large fishhook deeply embedded into left pointer finger.  States this occurred earlier today.  He tried to take needle-nose pliers and remove it himself but the pain was too great so he came in.  He denies numbness, tingling, decreased range of motion, significant bleeding.  Last tetanus was 1 year ago.  Has not taken anything for pain thus far.   Past Medical History:  Diagnosis Date   Hyperlipidemia     Patient Active Problem List   Diagnosis Date Noted   Family history of colorectal cancer 07/18/2019   Well adult exam 07/05/2017   Hyperlipidemia 05/09/2011   Sinusitis acute 05/08/2011   Gynecomastia, male 04/28/2011    Past Surgical History:  Procedure Laterality Date   APPENDECTOMY     TONSILLECTOMY         Home Medications    Prior to Admission medications   Medication Sig Start Date End Date Taking? Authorizing Provider  chlorhexidine (HIBICLENS) 4 % external liquid Apply topically daily as needed. 10/30/20  Yes Particia Nearing, PA-C  sulfamethoxazole-trimethoprim (BACTRIM DS) 800-160 MG tablet Take 1 tablet by mouth 2 (two) times daily for 7 days. 10/30/20 11/06/20 Yes Particia Nearing, PA-C  atorvastatin (LIPITOR) 40 MG tablet TAKE 1 TABLET (40 MG TOTAL) BY MOUTH DAILY. 10/03/19 11/03/20  Nestor Ramp, MD  esomeprazole (NEXIUM) 40 MG capsule Take 1 capsule (40 mg total) by mouth daily. 07/11/11 07/10/12  Nestor Ramp, MD    Family History Family History  Problem Relation Age of Onset   Healthy Mother    Pancreatic disease Father        has IPMN on imaging    Colon polyps Father    Diabetes Father    Colon polyps Brother    Alzheimer's disease Maternal Grandmother    Colon cancer Paternal Grandmother        died  in 44s of CRC    Alzheimer's disease Paternal Grandfather     Social History Social History   Tobacco Use   Smoking status: Never   Smokeless tobacco: Never  Substance Use Topics   Alcohol use: Not Currently   Drug use: Never     Allergies   Patient has no known allergies.   Review of Systems Review of Systems Per HPI  Physical Exam Triage Vital Signs ED Triage Vitals  Enc Vitals Group     BP 10/30/20 1610 126/80     Pulse Rate 10/30/20 1610 80     Resp 10/30/20 1610 16     Temp 10/30/20 1610 98.3 F (36.8 C)     Temp src --      SpO2 10/30/20 1610 98 %     Weight --      Height --      Head Circumference --      Peak Flow --      Pain Score 10/30/20 1606 0     Pain Loc --      Pain Edu? --      Excl. in GC? --    No data found.  Updated Vital Signs BP 126/80   Pulse 80   Temp 98.3 F (36.8 C)  Resp 16   SpO2 98%   Visual Acuity Right Eye Distance:   Left Eye Distance:   Bilateral Distance:    Right Eye Near:   Left Eye Near:    Bilateral Near:     Physical Exam Vitals and nursing note reviewed.  Constitutional:      Appearance: Normal appearance.  HENT:     Head: Atraumatic.  Eyes:     Extraocular Movements: Extraocular movements intact.     Conjunctiva/sclera: Conjunctivae normal.  Cardiovascular:     Rate and Rhythm: Normal rate and regular rhythm.  Pulmonary:     Effort: Pulmonary effort is normal.     Breath sounds: Normal breath sounds.  Musculoskeletal:        General: Normal range of motion.     Cervical back: Normal range of motion and neck supple.  Skin:    General: Skin is warm.     Comments: Fishhook deeply embedded into left distal pointer finger with small straight portion exposed no active bleeding  Neurological:     General: No focal deficit present.     Mental Status: He is oriented to person, place, and time.     Comments: Left hand neurovascularly intact  Psychiatric:        Mood and Affect: Mood normal.         Thought Content: Thought content normal.        Judgment: Judgment normal.     UC Treatments / Results  Labs (all labs ordered are listed, but only abnormal results are displayed) Labs Reviewed - No data to display  EKG   Radiology No results found.  Procedures Foreign Body Removal  Date/Time: 10/30/2020 4:56 PM Performed by: Particia Nearing, PA-C Authorized by: Particia Nearing, PA-C   Consent:    Consent obtained:  Verbal   Consent given by:  Patient   Risks, benefits, and alternatives were discussed: yes     Risks discussed:  Infection, incomplete removal and pain   Alternatives discussed:  Alternative treatment Universal protocol:    Procedure explained and questions answered to patient or proxy's satisfaction: yes     Relevant documents present and verified: yes     Patient identity confirmed:  Verbally with patient Location:    Location:  Finger   Finger location:  L index finger   Depth:  Intradermal   Tendon involvement:  None Pre-procedure details:    Imaging:  None   Neurovascular status: intact     Preparation: Patient was prepped and draped in usual sterile fashion   Anesthesia:    Anesthesia method:  Local infiltration   Local anesthetic:  Lidocaine 2% w/o epi Procedure type:    Procedure complexity:  Simple Procedure details:    Dissection of underlying tissues: no     Bloodless field: yes     Removal mechanism:  Hemostat   Foreign bodies recovered:  1   Description:  Fishhook   Intact foreign body removal: yes   Post-procedure details:    Neurovascular status: intact     Confirmation:  No additional foreign bodies on visualization   Skin closure:  None   Dressing:  Non-adherent dressing   Procedure completion:  Tolerated well, no immediate complications (including critical care time)  Medications Ordered in UC Medications - No data to display  Initial Impression / Assessment and Plan / UC Course  I have reviewed the triage  vital signs and the nursing notes.  Pertinent labs & imaging  results that were available during my care of the patient were reviewed by me and considered in my medical decision making (see chart for details).     Distal left index finger numbed with 1% lidocaine, area cleaned thoroughly.  Hemostats used to push the fishhook through on the barb side and the hook was able to then be pulled from the barb end directly out.  Patient tolerated the procedure well without immediate difficulty.  Given the depth of the wound and the location, will start Bactrim in addition to Hibiclens for wound care.  Strict return precautions given for worsening symptoms and signs of infection.  Tdap up-to-date  Final Clinical Impressions(s) / UC Diagnoses   Final diagnoses:  Fish hook injury of finger of left hand, initial encounter   Discharge Instructions   None    ED Prescriptions     Medication Sig Dispense Auth. Provider   sulfamethoxazole-trimethoprim (BACTRIM DS) 800-160 MG tablet Take 1 tablet by mouth 2 (two) times daily for 7 days. 14 tablet Particia Nearing, [place]   chlorhexidine (HIBICLENS) 4 % external liquid Apply topically daily as needed. 120 mL Particia Nearing, [place]      PDMP not reviewed this encounter.   Particia Nearing, [place] 10/30/20 1700

## 2020-10-30 NOTE — ED Triage Notes (Signed)
Pt up to date on Tdap

## 2020-11-10 ENCOUNTER — Ambulatory Visit (INDEPENDENT_AMBULATORY_CARE_PROVIDER_SITE_OTHER): Payer: 59 | Admitting: Family Medicine

## 2020-11-10 ENCOUNTER — Other Ambulatory Visit (HOSPITAL_COMMUNITY): Payer: Self-pay

## 2020-11-10 ENCOUNTER — Encounter: Payer: Self-pay | Admitting: Family Medicine

## 2020-11-10 ENCOUNTER — Other Ambulatory Visit: Payer: Self-pay

## 2020-11-10 VITALS — BP 129/89 | HR 70 | Ht 67.0 in | Wt 154.0 lb

## 2020-11-10 DIAGNOSIS — Z800 Family history of malignant neoplasm of digestive organs: Secondary | ICD-10-CM | POA: Diagnosis not present

## 2020-11-10 DIAGNOSIS — E7849 Other hyperlipidemia: Secondary | ICD-10-CM

## 2020-11-10 DIAGNOSIS — Z0000 Encounter for general adult medical examination without abnormal findings: Secondary | ICD-10-CM | POA: Diagnosis not present

## 2020-11-10 DIAGNOSIS — F4010 Social phobia, unspecified: Secondary | ICD-10-CM | POA: Diagnosis not present

## 2020-11-10 MED ORDER — PROPRANOLOL HCL 20 MG PO TABS
20.0000 mg | ORAL_TABLET | ORAL | 2 refills | Status: DC
  Filled 2020-11-10: qty 30, 6d supply, fill #0

## 2020-11-10 MED ORDER — PROPRANOLOL HCL 20 MG PO TABS
ORAL_TABLET | ORAL | 2 refills | Status: DC
  Filled 2020-11-10: qty 30, 6d supply, fill #0

## 2020-11-10 NOTE — Patient Instructions (Signed)
I like to use Dr. Rhea Belton or Dr. Elberta Leatherwood for GI.

## 2020-11-11 DIAGNOSIS — F4010 Social phobia, unspecified: Secondary | ICD-10-CM | POA: Insufficient documentation

## 2020-11-11 LAB — COMPREHENSIVE METABOLIC PANEL
ALT: 26 IU/L (ref 0–44)
AST: 25 IU/L (ref 0–40)
Albumin/Globulin Ratio: 2.3 — ABNORMAL HIGH (ref 1.2–2.2)
Albumin: 5.1 g/dL — ABNORMAL HIGH (ref 4.0–5.0)
Alkaline Phosphatase: 51 IU/L (ref 44–121)
BUN/Creatinine Ratio: 25 — ABNORMAL HIGH (ref 9–20)
BUN: 28 mg/dL — ABNORMAL HIGH (ref 6–24)
Bilirubin Total: 0.5 mg/dL (ref 0.0–1.2)
CO2: 25 mmol/L (ref 20–29)
Calcium: 9.5 mg/dL (ref 8.7–10.2)
Chloride: 102 mmol/L (ref 96–106)
Creatinine, Ser: 1.11 mg/dL (ref 0.76–1.27)
Globulin, Total: 2.2 g/dL (ref 1.5–4.5)
Glucose: 85 mg/dL (ref 65–99)
Potassium: 4.3 mmol/L (ref 3.5–5.2)
Sodium: 142 mmol/L (ref 134–144)
Total Protein: 7.3 g/dL (ref 6.0–8.5)
eGFR: 85 mL/min/{1.73_m2} (ref >59)

## 2020-11-11 LAB — LDL CHOLESTEROL, DIRECT: LDL Direct: 164 mg/dL — ABNORMAL HIGH (ref 0–99)

## 2020-11-11 NOTE — Assessment & Plan Note (Signed)
Is not usually a problem for him but with the upcoming 2 classes that require public speaking, we discussed options.  We will start with propranolol.  I advised him to try this at home to see how he reacts to the medicine.  Start with the 20 mg dose.  He can increase that up to 100 mg.  If he is not having success with this dose, he will let me know.

## 2020-11-11 NOTE — Assessment & Plan Note (Signed)
Well adult.  Getting regular exercise.  Reviewed healthcare maintenance.  He had tetanus shot earlier this year plans to get influenza shot the next month or so.  Is up-to-date on COVID screening.

## 2020-11-11 NOTE — Progress Notes (Signed)
    CHIEF COMPLAINT / HPI: #1.  Well adult physical 2.  Stage fright: He is taking a couple of public speaking classes as part of his degree requirements.  He has significant anxiety when speaking in front of any type of organization or crowd.  He has racing heart, sweating, anxiety that starts a couple hours even before class.  Is a 3 times a week class.  He wonders if there is medication to help him deal with the symptoms. 3.  Recently had a fishhook embedded in his finger.  That has healing well. 4.  Questions about early colon cancer screening.   PERTINENT  PMH / PSH: I have reviewed the patient's medications, allergies, past medical and surgical history, smoking status and updated in the EMR as appropriate. Significant colorectal cancer and polyp history in his family.  OBJECTIVE:  BP 129/89   Pulse 70   Ht 5\' 7"  (1.702 m)   Wt 154 lb (69.9 kg)   SpO2 100%   BMI 24.12 kg/m  Vital signs reviewed GENERALl: Well developed, well nourished, in no acute distress. HEENT: PERRLA, EOMI, sclerae are nonicteric NECK: Supple, FROM, without lymphadenopathy.  THYROID: normal without nodularity CAROTID ARTERIES: without bruits LUNGS: clear to auscultation bilaterally. No wheezes or rales. Normal respiratory effort HEART: Regular rate and rhythm, no murmurs. Distal pulses are bilaterally symmetrical, 2+. ABDOMEN: soft with positive bowel sounds. No masses noted MSK: MOE x 4. Normal muscle strength, bulk and tone. SKIN no rash. Normal temperature. NEURO: no focal deficits. Normal gait. Normal balance.   ASSESSMENT / PLAN:   Well adult exam Well adult.  Getting regular exercise.  Reviewed healthcare maintenance.  He had tetanus shot earlier this year plans to get influenza shot the next month or so.  Is up-to-date on COVID screening.  Hyperlipidemia Continues on atorvastatin without problem.  Will check LDL and liver functions today.  Anxiety with speaking in public Is not usually a  problem for him but with the upcoming 2 classes that require public speaking, we discussed options.  We will start with propranolol.  I advised him to try this at home to see how he reacts to the medicine.  Start with the 20 mg dose.  He can increase that up to 100 mg.  If he is not having success with this dose, he will let me know.  Family history of colorectal cancer Does have fairly significant history of early colon polyps and colon cancer in his PGF (deceased) in his 74s.  His brother had 8 polyps removed when he was 84. Father has polyps. I agree he probably should be evaluated for early screening.  He would like to wait till December when this semester is over and I think that is fine.  He will let me know if he needs an formal referral otherwise I have given him the information.  He is totally asymptomatic at this time.   January MD

## 2020-11-11 NOTE — Assessment & Plan Note (Signed)
Continues on atorvastatin without problem.  Will check LDL and liver functions today.

## 2020-11-11 NOTE — Assessment & Plan Note (Addendum)
Does have fairly significant history of early colon polyps and colon cancer in his PGF (deceased) in his 64s.  His brother had 8 polyps removed when he was 78. Father has polyps. I agree he probably should be evaluated for early screening.  He would like to wait till December when this semester is over and I think that is fine.  He will let me know if he needs an formal referral otherwise I have given him the information.  He is totally asymptomatic at this time.

## 2020-11-12 ENCOUNTER — Encounter: Payer: Self-pay | Admitting: Family Medicine

## 2020-11-23 ENCOUNTER — Encounter: Payer: Self-pay | Admitting: Family Medicine

## 2020-11-25 ENCOUNTER — Other Ambulatory Visit: Payer: Self-pay | Admitting: Family Medicine

## 2020-11-26 ENCOUNTER — Other Ambulatory Visit (HOSPITAL_COMMUNITY): Payer: Self-pay

## 2020-11-26 MED ORDER — ATORVASTATIN CALCIUM 40 MG PO TABS
40.0000 mg | ORAL_TABLET | Freq: Every day | ORAL | 3 refills | Status: DC
  Filled 2020-11-26: qty 90, 90d supply, fill #0
  Filled 2021-03-23: qty 90, 90d supply, fill #1
  Filled 2021-07-13: qty 90, 90d supply, fill #2
  Filled 2021-11-08: qty 90, 90d supply, fill #3

## 2021-03-03 ENCOUNTER — Other Ambulatory Visit: Payer: Self-pay | Admitting: Family Medicine

## 2021-03-03 DIAGNOSIS — E785 Hyperlipidemia, unspecified: Secondary | ICD-10-CM | POA: Diagnosis not present

## 2021-03-04 LAB — LDL CHOLESTEROL, DIRECT: LDL Direct: 92 mg/dL (ref 0–99)

## 2021-03-08 ENCOUNTER — Encounter: Payer: Self-pay | Admitting: Family Medicine

## 2021-03-23 ENCOUNTER — Other Ambulatory Visit (HOSPITAL_COMMUNITY): Payer: Self-pay

## 2021-04-22 ENCOUNTER — Other Ambulatory Visit (HOSPITAL_COMMUNITY): Payer: Self-pay

## 2021-07-13 ENCOUNTER — Other Ambulatory Visit (HOSPITAL_COMMUNITY): Payer: Self-pay

## 2021-07-22 ENCOUNTER — Ambulatory Visit
Admission: EM | Admit: 2021-07-22 | Discharge: 2021-07-22 | Disposition: A | Discharge status: Home / Self Care | Payer: 59 | Attending: Emergency Medicine | Admitting: Emergency Medicine

## 2021-07-22 ENCOUNTER — Encounter: Payer: Self-pay | Admitting: Emergency Medicine

## 2021-07-22 DIAGNOSIS — S61411A Laceration without foreign body of right hand, initial encounter: Secondary | ICD-10-CM | POA: Diagnosis not present

## 2021-07-22 NOTE — ED Provider Notes (Signed)
Justin Douglas    CSN: [id number] Arrival date & time: 07/22/21  1844      History   Chief Complaint Chief Complaint  Patient presents with   Laceration    HPI Justin Douglas is a 41 y.o. male.  Patient presents with a laceration on his right hand that occurred today just prior to arrival.  He was washing dishes when he cut his hand on a glass that broke.  Bleeding controlled with direct pressure.  He denies numbness, weakness, or other symptoms.  Last tetanus 2021.  The history is provided by the patient.   Past Medical History:  Diagnosis Date   Hyperlipidemia     Patient Active Problem List   Diagnosis Date Noted   Anxiety with speaking in public 11/11/2020   Family history of colorectal cancer 07/18/2019   Well adult exam 07/05/2017   Hyperlipidemia 05/09/2011    Past Surgical History:  Procedure Laterality Date   APPENDECTOMY     TONSILLECTOMY         Home Medications    Prior to Admission medications   Medication Sig Start Date End Date Taking? Authorizing Provider  atorvastatin (LIPITOR) 40 MG tablet Take 1 tablet (40 mg total) by mouth daily. 11/26/20  Yes Nestor Ramp, MD  chlorhexidine (HIBICLENS) 4 % external liquid Apply topically daily as needed. 10/30/20   Particia Nearing, PA-C  esomeprazole (NEXIUM) 40 MG capsule Take 1 capsule (40 mg total) by mouth daily. 07/11/11 07/10/12  Nestor Ramp, MD  propranolol (INDERAL) 20 MG tablet Take 1-5 tablets (20-100 mg total) by mouth as directed for stage fright. 11/10/20   Nestor Ramp, MD    Family History Family History  Problem Relation Age of Onset   Healthy Mother    Pancreatic disease Father        has IPMN on imaging    Colon polyps Father    Diabetes Father    Colon polyps Brother    Alzheimer's disease Maternal Grandmother    Colon cancer Paternal Grandmother        died in 11s of CRC    Alzheimer's disease Paternal Grandfather     Social History Social History   Tobacco Use    Smoking status: Never   Smokeless tobacco: Never  Substance Use Topics   Alcohol use: Not Currently   Drug use: Never     Allergies   Patient has no known allergies.   Review of Systems Review of Systems  Constitutional:  Negative for chills and fever.  Skin:  Positive for wound. Negative for color change.  Neurological:  Negative for weakness and numbness.  All other systems reviewed and are negative.   Physical Exam Triage Vital Signs ED Triage Vitals  Enc Vitals Group     BP      Pulse      Resp      Temp      Temp src      SpO2      Weight      Height      Head Circumference      Peak Flow      Pain Score      Pain Loc      Pain Edu?      Excl. in GC?    No data found.  Updated Vital Signs BP (!) 154/98   Pulse 78   Temp 97.9 F (36.6 C)   Resp 18  SpO2 99%   Visual Acuity Right Eye Distance:   Left Eye Distance:   Bilateral Distance:    Right Eye Near:   Left Eye Near:    Bilateral Near:     Physical Exam Vitals and nursing note reviewed.  Constitutional:      General: He is not in acute distress.    Appearance: Normal appearance. He is well-developed. He is not ill-appearing.  HENT:     Mouth/Throat:     Mouth: Mucous membranes are moist.  Cardiovascular:     Rate and Rhythm: Normal rate and regular rhythm.  Pulmonary:     Effort: Pulmonary effort is normal. No respiratory distress.  Musculoskeletal:        General: Normal range of motion.     Cervical back: Neck supple.  Skin:    General: Skin is warm and dry.     Findings: Lesion present.     Comments: Half circle shaped laceration on dorsum of right hand at the base of his fifth finger.  Bleeding controlled.  Neurological:     General: No focal deficit present.     Mental Status: He is alert and oriented to person, place, and time.     Sensory: No sensory deficit.     Motor: No weakness.  Psychiatric:        Mood and Affect: Mood normal.        Behavior: Behavior normal.      UC Treatments / Results  Labs (all labs ordered are listed, but only abnormal results are displayed) Labs Reviewed - No data to display  EKG   Radiology No results found.  Procedures Laceration Repair  Date/Time: 07/22/2021 7:21 PM Performed by: Mickie Bail, NP Authorized by: Mickie Bail, NP   Consent:    Consent obtained:  Verbal   Consent given by:  Patient   Risks discussed:  Infection, pain, poor cosmetic result and poor wound healing Universal protocol:    Procedure explained and questions answered to patient or proxy's satisfaction: yes   Anesthesia:    Anesthesia method:  Local infiltration   Local anesthetic:  Lidocaine 1% w/o epi Laceration details:    Location:  Hand   Hand location:  R hand, dorsum   Length (cm):  3 Pre-procedure details:    Preparation:  Patient was prepped and draped in usual sterile fashion Exploration:    Hemostasis achieved with:  Direct pressure   Imaging outcome: foreign body not noted     Wound exploration: wound explored through full range of motion and entire depth of wound visualized   Treatment:    Area cleansed with:  Povidone-iodine   Amount of cleaning:  Standard   Irrigation solution:  Sterile water   Irrigation method:  Syringe   Visualized foreign bodies/material removed: no   Skin repair:    Repair method:  Sutures   Suture size:  4-0   Suture material:  Nylon   Suture technique:  Simple interrupted   Number of sutures:  7 Approximation:    Approximation:  Close Repair type:    Repair type:  Simple Post-procedure details:    Dressing:  Antibiotic ointment and non-adherent dressing   Procedure completion:  Tolerated well, no immediate complications (including critical care time)  Medications Ordered in UC Medications - No data to display  Initial Impression / Assessment and Plan / UC Course  I have reviewed the triage vital signs and the nursing notes.  Pertinent labs &  imaging results that were  available during my care of the patient were reviewed by me and considered in my medical decision making (see chart for details).  Laceration of right hand.  7 sutures.  Tetanus up-to-date.  Wound care instructions and signs of infection discussed with patient.  Education provided on laceration care.  Instructed patient to have sutures removed in 7 to 10 days.  Instructed him to return right away if he notes signs of infection.  Patient agrees to plan of care.   Final Clinical Impressions(s) / UC Diagnoses   Final diagnoses:  Laceration of right hand without foreign body, initial encounter     Discharge Instructions      Your stitches need to be removed in 7 to 10 days.  Keep your wound clean and dry.  Wash it gently twice a day with soap and water.  Apply an antibiotic cream twice a day for 2-3 days.    Return here right away if you see signs of infection, such as redness, pus-like drainage, fever, chills, or other concerning symptoms.        ED Prescriptions   None    PDMP not reviewed this encounter.   Mickie Bailate, Tamekia Rotter H, NP 07/22/21 1924

## 2021-07-22 NOTE — Discharge Instructions (Addendum)
Your stitches need to be removed in 7 to 10 days.  Keep your wound clean and dry.  Wash it gently twice a day with soap and water.  Apply an antibiotic cream twice a day for 2-3 days.    Return here right away if you see signs of infection, such as redness, pus-like drainage, fever, chills, or other concerning symptoms.

## 2021-07-22 NOTE — ED Triage Notes (Signed)
Patient c/o laceration that occurred today at...  Patient endorses laceration is present on RT hand.   Patient states " I was cut trying to catch a glass".   Patient has area wrapped with a wash cloth currently.   Last Tdap 06/2019 per Epic chart.

## 2021-07-27 ENCOUNTER — Ambulatory Visit (INDEPENDENT_AMBULATORY_CARE_PROVIDER_SITE_OTHER): Payer: 59 | Admitting: Family Medicine

## 2021-07-27 DIAGNOSIS — S0501XA Injury of conjunctiva and corneal abrasion without foreign body, right eye, initial encounter: Secondary | ICD-10-CM

## 2021-08-09 ENCOUNTER — Encounter: Payer: Self-pay | Admitting: *Deleted

## 2021-08-19 ENCOUNTER — Other Ambulatory Visit: Payer: Self-pay | Admitting: Family Medicine

## 2021-08-19 DIAGNOSIS — R5383 Other fatigue: Secondary | ICD-10-CM

## 2021-08-22 ENCOUNTER — Encounter: Payer: Self-pay | Admitting: Family Medicine

## 2021-08-24 LAB — TESTOSTERONE,FREE AND TOTAL
Testosterone, Free: 10.9 pg/mL (ref 6.8–21.5)
Testosterone: 334 ng/dL (ref 264–916)

## 2021-08-24 LAB — CBC
Hematocrit: 41.2 % (ref 37.5–51.0)
Hemoglobin: 14.3 g/dL (ref 13.0–17.7)
MCH: 30.2 pg (ref 26.6–33.0)
MCHC: 34.7 g/dL (ref 31.5–35.7)
MCV: 87 fL (ref 79–97)
Platelets: 168 10*3/uL (ref 150–450)
RBC: 4.74 x10E6/uL (ref 4.14–5.80)
RDW: 12.2 % (ref 11.6–15.4)
WBC: 5.4 10*3/uL (ref 3.4–10.8)

## 2021-08-24 LAB — TSH: TSH: 1.09 u[IU]/mL (ref 0.450–4.500)

## 2021-11-08 ENCOUNTER — Other Ambulatory Visit (HOSPITAL_COMMUNITY): Payer: Self-pay

## 2022-02-24 ENCOUNTER — Telehealth: Payer: Self-pay | Admitting: Student

## 2022-02-24 ENCOUNTER — Encounter: Payer: Self-pay | Admitting: Family Medicine

## 2022-02-24 DIAGNOSIS — S0501XA Injury of conjunctiva and corneal abrasion without foreign body, right eye, initial encounter: Secondary | ICD-10-CM | POA: Insufficient documentation
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
## 2022-02-24 NOTE — Progress Notes (Addendum)
This encounter occurred on 02/24/22 and was precepted with Dr. Perley Jain

## 2022-02-24 NOTE — Assessment & Plan Note (Addendum)
Patient comes in for right eye pain, following accidental hand/trauma to eye. Proparacaine eye drops given for pain relief. Eye examined under UV light and showed corneal abrasion at 12-3 o'clock on eye.   -Sulfacetamide abx drops given  1-2 drops, 3-4 times a day, for 5 days -f/u in 48 hours

## 2022-02-26 ENCOUNTER — Encounter: Payer: Self-pay | Admitting: Student

## 2022-02-26 DIAGNOSIS — S0501XD Injury of conjunctiva and corneal abrasion without foreign body, right eye, subsequent encounter: Secondary | ICD-10-CM

## 2022-02-26 NOTE — Progress Notes (Unsigned)
  SUBJECTIVE:   CHIEF COMPLAINT / HPI:   Follow up corneal abrasion Patient seen for corneal abrasion on 12/22. Patient reprorts there is no more pain, but his vision is blurry and he can't read things on his phone. Also appreciates some double vision and halo's on car lights.   PERTINENT  PMH / PSH:   Past Medical History:  Diagnosis Date   Hyperlipidemia     OBJECTIVE:  There were no vitals taken for this visit. Physical Exam Eyes:     General: Lids are normal.        Right eye: No foreign body, discharge or hordeolum.     Conjunctiva/sclera:     Right eye: Right conjunctiva is injected. No exudate or hemorrhage.    Comments: Vision 20/30, patchy abrasion overlying pupil of right eye, extending in the 12-3 O'clock position      ASSESSMENT/PLAN:  Abrasion of right cornea, subsequent encounter Assessment & Plan: Patient reports improvement in pain, but decrease in vision. Fluorescein/UV light exam showed abrasion in the 12-3 oclock position, overlying the pupil, but appears smaller than before. Patient eye injected, but no hyphema, no signs of infection or drainage. Given change to vision will recommend patient be evaluated in ED for Ophtomalogy consult. Patient w/ significant obligation with his family tonight, but is agreeable to going in the morning.  -ED follow up in am    No follow-ups on file. Bess Kinds, MD 02/26/2022, 6:21 PM PGY-2, [facility] {    This will disappear when note is signed, click to select method of visit    :1}

## 2022-02-26 NOTE — Assessment & Plan Note (Signed)
Patient reports improvement in pain, but decrease in vision. Fluorescein/UV light exam showed abrasion in the 12-3 oclock position, overlying the pupil, but appears smaller than before. Patient eye injected, but no hyphema, no signs of infection or drainage. Given change to vision will recommend patient be evaluated in ED for Ophtomalogy consult. Patient w/ significant obligation with his family tonight, but is agreeable to going in the morning.  -ED follow up in am

## 2022-02-27 ENCOUNTER — Emergency Department (HOSPITAL_COMMUNITY)
Admission: EM | Admit: 2022-02-27 | Discharge: 2022-02-27 | Discharge status: Against Medical Advice | Payer: 59 | Attending: Emergency Medicine | Admitting: Emergency Medicine

## 2022-02-27 ENCOUNTER — Other Ambulatory Visit: Payer: Self-pay

## 2022-02-27 ENCOUNTER — Encounter (HOSPITAL_COMMUNITY): Payer: Self-pay

## 2022-02-27 DIAGNOSIS — Z5321 Procedure and treatment not carried out due to patient leaving prior to being seen by health care provider: Secondary | ICD-10-CM | POA: Diagnosis not present

## 2022-02-27 DIAGNOSIS — S0501XA Injury of conjunctiva and corneal abrasion without foreign body, right eye, initial encounter: Secondary | ICD-10-CM | POA: Insufficient documentation

## 2022-02-27 DIAGNOSIS — X58XXXA Exposure to other specified factors, initial encounter: Secondary | ICD-10-CM | POA: Diagnosis not present

## 2022-02-27 NOTE — ED Notes (Signed)
Patient told registration staff that he no longer wanted to wait and left without being seen.

## 2022-02-27 NOTE — ED Provider Triage Note (Signed)
Emergency Medicine Provider Triage Evaluation Note  Justin Douglas , a 41 y.o. male  was evaluated in triage.  Pt complains of corneal abrasion, blurry vision, and redness to the right eye.  Patient was seen at the doctor and given sulfacetamide drops to treat corneal abrasion.  He states this happened a few days ago and his vision has gotten extremely blurry.  He does not have any pain with eye movement.  He also reports he has watery discharge from his eye.  Review of Systems  Positive: As above Negative: As above  Physical Exam  BP 129/80   Pulse 77   Temp 98 F (36.7 C) (Oral)   Resp 15   Ht 5' 6.5" (1.689 m)   Wt 65.8 kg   SpO2 97%   BMI 23.05 kg/m  Gen:   Awake, no distress   Resp:  Normal effort  MSK:   Moves extremities without difficulty  Other:  Right eye is injected, watery discharge, EOM intact, patient reports blurry vision.  Medical Decision Making  Medically screening exam initiated at 12:57 PM.  Appropriate orders placed.  Doris Cheadle Ace was informed that the remainder of the evaluation will be completed by another provider, this initial triage assessment does not replace that evaluation, and the importance of remaining in the ED until their evaluation is complete.     Melton Alar R, [place] 02/27/22 1258

## 2022-02-27 NOTE — ED Triage Notes (Signed)
Pt reports eye injury on Thursday. States he was treated for a corneal abrasion. Pt reports he was told to come to the ER due to his vision not improving. Pt AxOx4, NAD.

## 2022-02-28 ENCOUNTER — Other Ambulatory Visit (HOSPITAL_COMMUNITY): Payer: Self-pay

## 2022-02-28 DIAGNOSIS — S0501XA Injury of conjunctiva and corneal abrasion without foreign body, right eye, initial encounter: Secondary | ICD-10-CM | POA: Diagnosis not present

## 2022-02-28 MED ORDER — PREDNISOLONE ACETATE 1 % OP SUSP
1.0000 [drp] | Freq: Four times a day (QID) | OPHTHALMIC | 0 refills | Status: DC
  Filled 2022-02-28: qty 15, 37d supply, fill #0

## 2022-03-15 DIAGNOSIS — S0501XD Injury of conjunctiva and corneal abrasion without foreign body, right eye, subsequent encounter: Secondary | ICD-10-CM | POA: Diagnosis not present

## 2022-03-15 DIAGNOSIS — H52223 Regular astigmatism, bilateral: Secondary | ICD-10-CM | POA: Diagnosis not present

## 2022-03-15 DIAGNOSIS — H209 Unspecified iridocyclitis: Secondary | ICD-10-CM | POA: Diagnosis not present

## 2022-03-16 ENCOUNTER — Other Ambulatory Visit: Payer: Self-pay | Admitting: Family Medicine

## 2022-03-17 ENCOUNTER — Other Ambulatory Visit (HOSPITAL_COMMUNITY): Payer: Self-pay

## 2022-03-17 MED ORDER — ATORVASTATIN CALCIUM 40 MG PO TABS
40.0000 mg | ORAL_TABLET | Freq: Every day | ORAL | 3 refills | Status: DC
  Filled 2022-03-17: qty 90, 90d supply, fill #0
  Filled 2022-07-28 (×2): qty 90, 90d supply, fill #1
  Filled 2022-12-14 – 2022-12-15 (×2): qty 90, 90d supply, fill #2

## 2022-06-14 ENCOUNTER — Ambulatory Visit (INDEPENDENT_AMBULATORY_CARE_PROVIDER_SITE_OTHER): Payer: 59 | Admitting: Family Medicine

## 2022-06-14 ENCOUNTER — Encounter: Payer: Self-pay | Admitting: Family Medicine

## 2022-06-14 VITALS — BP 116/74 | HR 68 | Ht 66.5 in | Wt 141.0 lb

## 2022-06-14 DIAGNOSIS — Z0000 Encounter for general adult medical examination without abnormal findings: Secondary | ICD-10-CM

## 2022-06-14 DIAGNOSIS — E782 Mixed hyperlipidemia: Secondary | ICD-10-CM

## 2022-06-14 NOTE — Patient Instructions (Signed)
Great to see you!   

## 2022-06-14 NOTE — Assessment & Plan Note (Signed)
Check labs and continue current medication regimen.

## 2022-06-14 NOTE — Assessment & Plan Note (Signed)
Well adult physical.  Reviewed health maintenance.  Recommend evaluation by GI to see if colonoscopy is warranted.  He is 42 at this point so certainly my next year he would likely qualify.

## 2022-06-14 NOTE — Progress Notes (Signed)
    CHIEF COMPLAINT / HPI:   Here for physical/checkup.  Doing pretty well.  Injured his left bicep area about 2 months ago lifting weights.  Has gotten better but still occasionally twinges.  Hurts some if he lies flat with his arms outstretched or over his head.  Did not get his colonoscopy yet.  Male who history of polyps.  He is taking his cholesterol medicine without issue.  PERTINENT  PMH / PSH: I have reviewed the patient's medications, allergies, past medical and surgical history, smoking status and updated in the EMR as appropriate. Had a corneal abrasion this year which took a long time to heal but fortunately has finally resolved.  OBJECTIVE:  BP 116/74   Pulse 68   Ht 5' 6.5" (1.689 m)   Wt 141 lb (64 kg)   SpO2 99%   BMI 22.42 kg/m   Vital signs reviewed. GENERAL: Well-developed, well-nourished, no acute distress. CARDIOVASCULAR: Regular rate and rhythm no murmur gallop or rub LUNGS: Clear to auscultation bilaterally, no rales or wheeze. ABDOMEN: Soft positive bowel sounds NEURO: No gross focal neurological deficits. MSK: Movement of extremity x 4. SHOULDER: Left.  Full range of motion.  Negative grind test.  Cannot elicit any labral pathology.  Bicep tendon is nontender to palpation.  Cannot reproduce his shoulder pain. ASSESSMENT / PLAN:   Well adult exam Well adult physical.  Reviewed health maintenance.  Recommend evaluation by GI to see if colonoscopy is warranted.  He is 42 at this point so certainly my next year he would likely qualify.  Hyperlipidemia Check labs and continue current medication regimen.   [name] MD

## 2022-06-16 DIAGNOSIS — E782 Mixed hyperlipidemia: Secondary | ICD-10-CM | POA: Diagnosis not present

## 2022-06-17 LAB — COMPREHENSIVE METABOLIC PANEL
ALT: 21 IU/L (ref 0–44)
AST: 21 IU/L (ref 0–40)
Albumin/Globulin Ratio: 2.1 (ref 1.2–2.2)
Albumin: 5 g/dL (ref 4.1–5.1)
Alkaline Phosphatase: 55 IU/L (ref 44–121)
BUN/Creatinine Ratio: 15 (ref 9–20)
BUN: 18 mg/dL (ref 6–24)
Bilirubin Total: 0.8 mg/dL (ref 0.0–1.2)
CO2: 24 mmol/L (ref 20–29)
Calcium: 10.1 mg/dL (ref 8.7–10.2)
Chloride: 102 mmol/L (ref 96–106)
Creatinine, Ser: 1.23 mg/dL (ref 0.76–1.27)
Globulin, Total: 2.4 g/dL (ref 1.5–4.5)
Glucose: 90 mg/dL (ref 70–99)
Potassium: 4.9 mmol/L (ref 3.5–5.2)
Sodium: 142 mmol/L (ref 134–144)
Total Protein: 7.4 g/dL (ref 6.0–8.5)
eGFR: 74 mL/min/{1.73_m2} (ref >59)

## 2022-06-17 LAB — LIPID PANEL
Chol/HDL Ratio: 2.7 ratio (ref 0.0–5.0)
Cholesterol, Total: 211 mg/dL — ABNORMAL HIGH (ref 100–199)
HDL: 77 mg/dL (ref >39)
LDL Chol Calc (NIH): 114 mg/dL — ABNORMAL HIGH (ref 0–99)
Triglycerides: 115 mg/dL (ref 0–149)
VLDL Cholesterol Cal: 20 mg/dL (ref 5–40)

## 2022-06-20 ENCOUNTER — Encounter: Payer: Self-pay | Admitting: Family Medicine

## 2022-07-05 ENCOUNTER — Encounter: Payer: Self-pay | Admitting: Family Medicine

## 2022-07-05 ENCOUNTER — Ambulatory Visit (INDEPENDENT_AMBULATORY_CARE_PROVIDER_SITE_OTHER): Payer: 59 | Admitting: Family Medicine

## 2022-07-05 ENCOUNTER — Other Ambulatory Visit: Payer: Self-pay

## 2022-07-05 VITALS — BP 124/78 | Ht 66.5 in | Wt 145.0 lb

## 2022-07-05 DIAGNOSIS — M25522 Pain in left elbow: Secondary | ICD-10-CM

## 2022-07-05 NOTE — Assessment & Plan Note (Addendum)
Distal bicipital tendinitis.  Encouraged him to use ice or heat whichever is most comfortable for him and begin him the stretches and exercises that we have provided him.  This should continue to improve with appropriate rehabilitation measures.  If he does not have much improvement over the next couple weeks would recommend formal physical therapy evaluation.  He may use topical anti-inflammatories such as Voltaren gel.  He verbalized understanding.  Follow-up 1 month

## 2022-07-05 NOTE — Patient Instructions (Signed)
You have biceps tendinitis. Do home exercises daily as directed. Ice or heat 15 minutes at a time as needed. Topical voltaren gel up to 4 times a day.  If this isn't helping enough you can take aleve 2 tabs twice a day with food. Consider nitro patches, formal physical therapy if not improving. Follow up with me in 1 month.

## 2022-07-05 NOTE — Progress Notes (Signed)
   New Patient Office Visit  Subjective   Patient ID: Justin Douglas, male    DOB: 10/04/1980  Age: 42 y.o. MRN: [id number]  Arm pain  He is here today with chief complaint of left arm pain for the past couple of months.  He denies any acute injury but reports it started bothering him after a day at the gym.  He has not tried any medications at this time.  His pain has been worsening and he noticed he had some extreme pain up in his left arm when he was hitting some golf balls over the weekend which prompted him to seek evaluation.  Most of his pain is located the distal biceps area.  He denies any bruising or swelling or hearing a pop.  Of note he is right-hand dominant   ROS as listed above in HPI    Objective:     BP 124/78   Ht 5' 6.5" (1.689 m)   Wt 145 lb (65.8 kg)   BMI 23.05 kg/m    Physical Exam Vitals reviewed.  Constitutional:      Appearance: Normal appearance. He is not ill-appearing, toxic-appearing or diaphoretic.  Pulmonary:     Effort: Pulmonary effort is normal.  Skin:    General: Skin is warm.  Neurological:     Mental Status: He is alert.   Left elbow: No obvious deformity or asymmetry.  No ecchymosis or edema.  No tenderness to palpation at the medial or lateral epicondyles.  Some slight discomfort deep palpation of the biceps.  He has full range of motion at his shoulder and elbow flexion, extension, supination and pronation.  Strength 5/5 resisted flexion and extension of the elbow.  Strength 5/5 resisted supination.  Some slight discomfort with resisted supination.  Negative hook test, palpable cordlike structure in the antecubital fossa. grip strength 5/5.  Sensation intact. Pain at the elbow with resisted external rotation  Limited ultrasound, left elbow Biceps tendon was visualized, no hyperechoic changes were visualized, the fibers all appear to be intact and inserting on the radial head.  No hypoechoic fluid collection surrounding the  area. Impression: Benign ultrasound, biceps tendon is intact    Assessment & Plan:   Problem List Items Addressed This Visit       Other   Arthralgia of left upper arm - Primary    Distal bicipital tendinitis.  Encouraged him to use ice or heat whichever is most comfortable for him and begin him the stretches and exercises that we have provided him.  This should continue to improve with appropriate rehabilitation measures.  If he does not have much improvement over the next couple weeks would recommend formal physical therapy evaluation.  He may use topical anti-inflammatories such as Voltaren gel.  He verbalized understanding.  Follow-up 1 month      Relevant Orders   [place] LIMITED JOINT SPACE STRUCTURES UP LEFT    Return in about 4 weeks (around 08/02/2022).    Claudie Leach, DO

## 2022-07-28 ENCOUNTER — Other Ambulatory Visit: Payer: Self-pay

## 2022-07-28 ENCOUNTER — Other Ambulatory Visit (HOSPITAL_COMMUNITY): Payer: Self-pay

## 2022-12-15 ENCOUNTER — Other Ambulatory Visit (HOSPITAL_COMMUNITY): Payer: Self-pay

## 2022-12-15 ENCOUNTER — Other Ambulatory Visit: Payer: Self-pay

## 2023-01-01 ENCOUNTER — Other Ambulatory Visit (HOSPITAL_BASED_OUTPATIENT_CLINIC_OR_DEPARTMENT_OTHER): Payer: Self-pay

## 2023-02-15 ENCOUNTER — Encounter: Payer: Self-pay | Admitting: Family Medicine

## 2023-02-15 ENCOUNTER — Other Ambulatory Visit: Payer: Self-pay | Admitting: Family Medicine

## 2023-02-15 DIAGNOSIS — E782 Mixed hyperlipidemia: Secondary | ICD-10-CM

## 2023-02-26 DIAGNOSIS — E782 Mixed hyperlipidemia: Secondary | ICD-10-CM | POA: Diagnosis not present

## 2023-02-27 ENCOUNTER — Encounter: Payer: Self-pay | Admitting: Family Medicine

## 2023-02-27 LAB — LDL CHOLESTEROL, DIRECT: LDL Direct: 134 mg/dL — ABNORMAL HIGH (ref 0–99)

## 2023-04-19 ENCOUNTER — Other Ambulatory Visit: Payer: Self-pay | Admitting: Family Medicine

## 2023-04-19 ENCOUNTER — Other Ambulatory Visit (HOSPITAL_COMMUNITY): Payer: Self-pay

## 2023-04-19 MED ORDER — ATORVASTATIN CALCIUM 40 MG PO TABS
40.0000 mg | ORAL_TABLET | Freq: Every day | ORAL | 3 refills | Status: AC
  Filled 2023-04-19: qty 90, 90d supply, fill #0
  Filled 2023-08-17: qty 90, 90d supply, fill #1
  Filled 2023-11-28: qty 90, 90d supply, fill #2
  Filled 2024-04-03: qty 90, 90d supply, fill #3

## 2024-02-08 ENCOUNTER — Other Ambulatory Visit (HOSPITAL_BASED_OUTPATIENT_CLINIC_OR_DEPARTMENT_OTHER): Payer: Self-pay
